# Patient Record
Sex: Female | Born: 1985 | Race: Black or African American | Hispanic: No | Marital: Single | Smoking: Current every day smoker
Health system: Southern US, Community
[De-identification: ages and names within clinical notes are randomized; demographics above are authoritative.]

## PROBLEM LIST (undated history)

## (undated) DIAGNOSIS — D573 Sickle-cell trait: Secondary | ICD-10-CM

---

## 2004-09-01 ENCOUNTER — Emergency Department (HOSPITAL_COMMUNITY): Admission: EM | Admit: 2004-09-01 | Discharge: 2004-09-01 | Payer: Self-pay | Admitting: Emergency Medicine

## 2004-10-11 ENCOUNTER — Emergency Department (HOSPITAL_COMMUNITY): Admission: EM | Admit: 2004-10-11 | Discharge: 2004-10-11 | Payer: Self-pay | Admitting: Emergency Medicine

## 2005-01-03 ENCOUNTER — Inpatient Hospital Stay (HOSPITAL_COMMUNITY): Admission: AD | Admit: 2005-01-03 | Discharge: 2005-01-03 | Payer: Self-pay | Admitting: Obstetrics and Gynecology

## 2005-05-12 ENCOUNTER — Emergency Department (HOSPITAL_COMMUNITY): Admission: EM | Admit: 2005-05-12 | Discharge: 2005-05-12 | Payer: Self-pay | Admitting: Family Medicine

## 2005-09-03 ENCOUNTER — Emergency Department (HOSPITAL_COMMUNITY): Admission: EM | Admit: 2005-09-03 | Discharge: 2005-09-04 | Payer: Self-pay | Admitting: Emergency Medicine

## 2005-11-28 ENCOUNTER — Ambulatory Visit (HOSPITAL_COMMUNITY): Admission: RE | Admit: 2005-11-28 | Discharge: 2005-11-28 | Payer: Self-pay | Admitting: Obstetrics & Gynecology

## 2006-01-29 ENCOUNTER — Inpatient Hospital Stay (HOSPITAL_COMMUNITY): Admission: AD | Admit: 2006-01-29 | Discharge: 2006-01-31 | Payer: Self-pay | Admitting: Obstetrics & Gynecology

## 2006-03-10 ENCOUNTER — Inpatient Hospital Stay (HOSPITAL_COMMUNITY): Admission: AD | Admit: 2006-03-10 | Discharge: 2006-03-11 | Payer: Self-pay | Admitting: Obstetrics & Gynecology

## 2006-03-20 ENCOUNTER — Inpatient Hospital Stay (HOSPITAL_COMMUNITY): Admission: AD | Admit: 2006-03-20 | Discharge: 2006-03-22 | Payer: Self-pay | Admitting: Obstetrics

## 2006-05-09 ENCOUNTER — Emergency Department (HOSPITAL_COMMUNITY): Admission: EM | Admit: 2006-05-09 | Discharge: 2006-05-10 | Payer: Self-pay | Admitting: Emergency Medicine

## 2006-07-01 ENCOUNTER — Emergency Department (HOSPITAL_COMMUNITY): Admission: EM | Admit: 2006-07-01 | Discharge: 2006-07-01 | Payer: Self-pay | Admitting: Emergency Medicine

## 2006-10-17 ENCOUNTER — Emergency Department (HOSPITAL_COMMUNITY): Admission: EM | Admit: 2006-10-17 | Discharge: 2006-10-17 | Payer: Self-pay | Admitting: Emergency Medicine

## 2007-06-16 ENCOUNTER — Inpatient Hospital Stay (HOSPITAL_COMMUNITY): Admission: AD | Admit: 2007-06-16 | Discharge: 2007-06-16 | Payer: Self-pay | Admitting: Obstetrics & Gynecology

## 2007-08-18 ENCOUNTER — Ambulatory Visit (HOSPITAL_COMMUNITY): Admission: AD | Admit: 2007-08-18 | Discharge: 2007-08-18 | Payer: Self-pay | Admitting: Obstetrics

## 2007-08-18 ENCOUNTER — Encounter: Payer: Self-pay | Admitting: Obstetrics

## 2008-03-03 ENCOUNTER — Inpatient Hospital Stay (HOSPITAL_COMMUNITY): Admission: AD | Admit: 2008-03-03 | Discharge: 2008-03-03 | Payer: Self-pay | Admitting: Obstetrics

## 2008-05-20 ENCOUNTER — Ambulatory Visit (HOSPITAL_COMMUNITY): Admission: RE | Admit: 2008-05-20 | Discharge: 2008-05-20 | Payer: Self-pay | Admitting: Obstetrics

## 2008-06-03 ENCOUNTER — Ambulatory Visit (HOSPITAL_COMMUNITY): Admission: RE | Admit: 2008-06-03 | Discharge: 2008-06-03 | Payer: Self-pay | Admitting: Obstetrics

## 2008-06-08 ENCOUNTER — Inpatient Hospital Stay (HOSPITAL_COMMUNITY): Admission: AD | Admit: 2008-06-08 | Discharge: 2008-06-10 | Payer: Self-pay | Admitting: Obstetrics & Gynecology

## 2008-06-22 ENCOUNTER — Ambulatory Visit (HOSPITAL_COMMUNITY): Admission: RE | Admit: 2008-06-22 | Discharge: 2008-06-22 | Payer: Self-pay | Admitting: Obstetrics

## 2008-09-26 IMAGING — CR DG NECK SOFT TISSUE
1 series · 1 of 1 positions shown · non-contrast
Comparison: none

CLINICAL DATA: Sore throat.  Unable to swallow.  Hoarse. 
 LATERAL SOFT TISSUE NECK ? 1 VIEW:

[w soft tissue neck *]
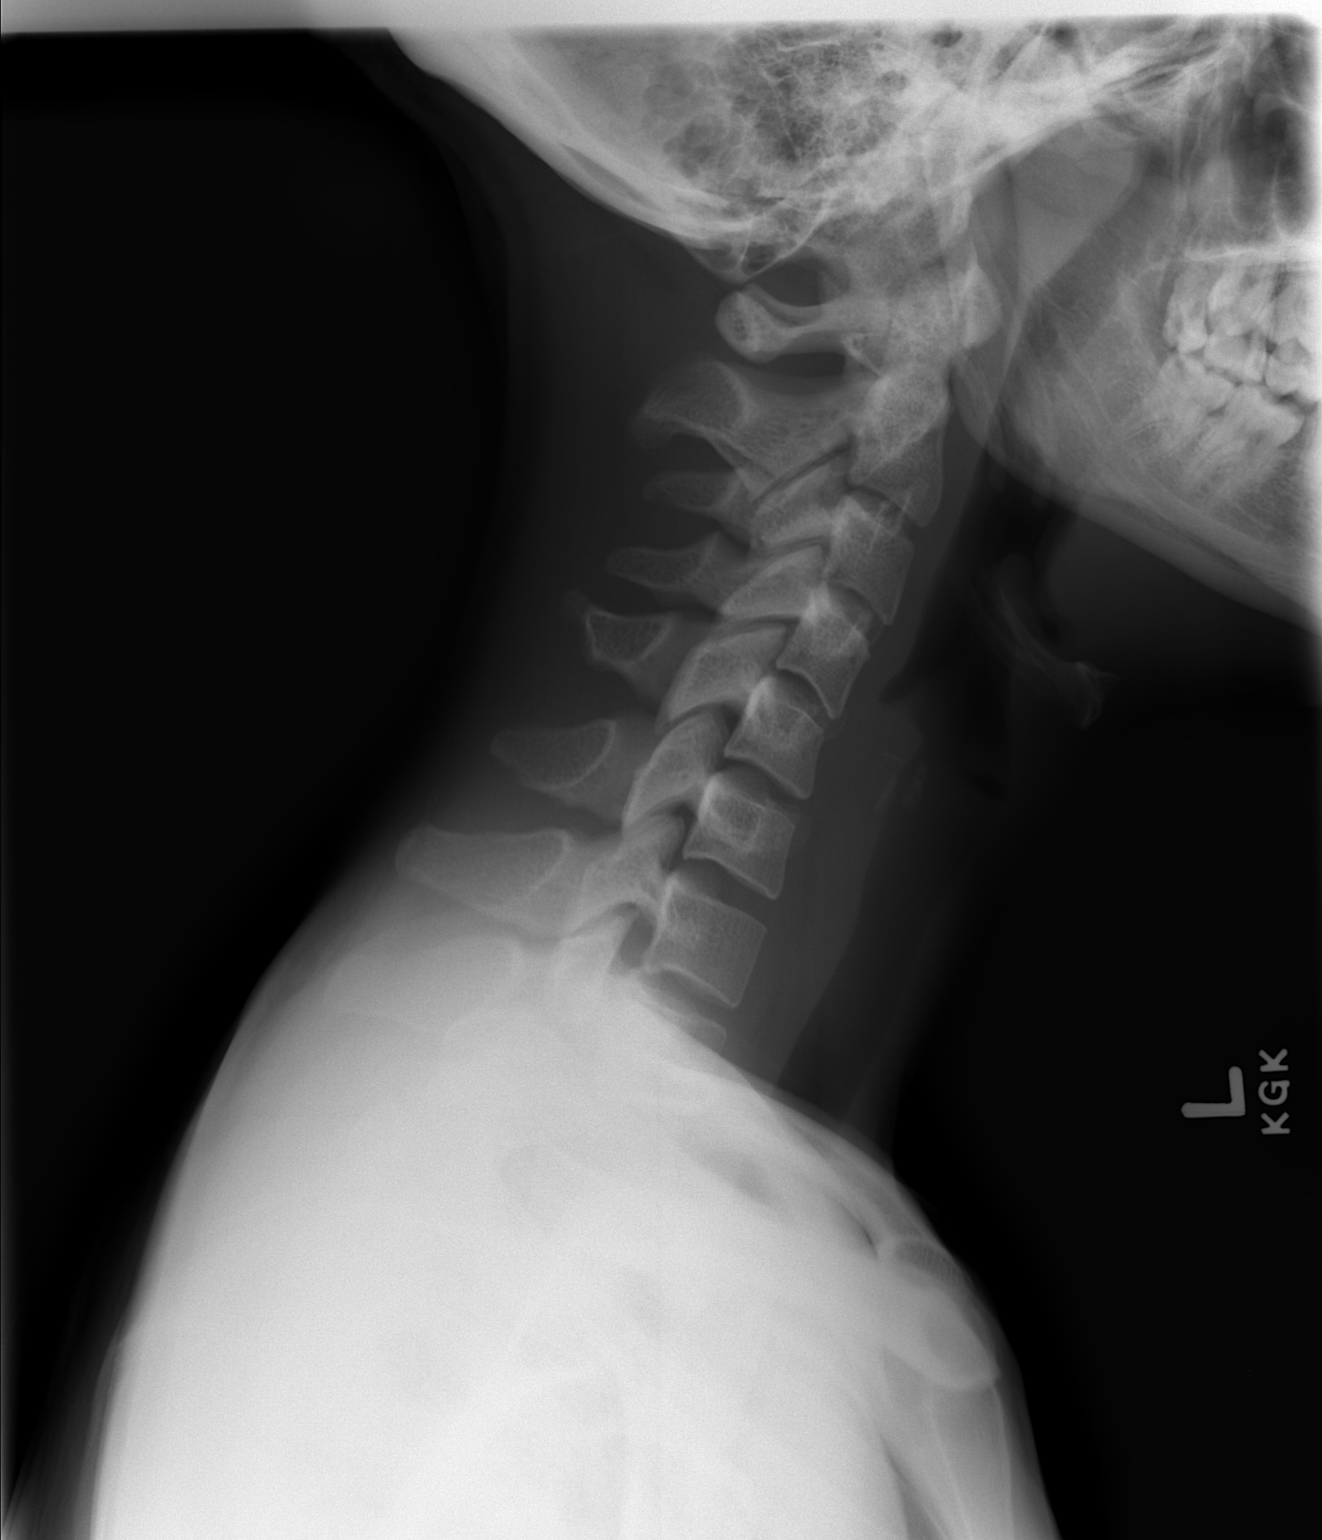

[1 of 1 positions shown; findings below may reference images not displayed]

FINDINGS: There is no abnormal prevertebral soft tissue swelling.  The bony structures appear normal.  The epiglottis is not narrowed.  Slight prominence of the lymphoid tissue at the base of the tongue without compromise of the airway.
IMPRESSION: No significant abnormality.

## 2008-10-12 ENCOUNTER — Inpatient Hospital Stay (HOSPITAL_COMMUNITY): Admission: AD | Admit: 2008-10-12 | Discharge: 2008-10-14 | Payer: Self-pay | Admitting: Obstetrics

## 2009-03-24 ENCOUNTER — Emergency Department (HOSPITAL_COMMUNITY): Admission: EM | Admit: 2009-03-24 | Discharge: 2009-03-24 | Payer: Self-pay | Admitting: Emergency Medicine

## 2009-05-16 ENCOUNTER — Encounter: Payer: Self-pay | Admitting: Emergency Medicine

## 2009-05-16 ENCOUNTER — Inpatient Hospital Stay (HOSPITAL_COMMUNITY): Admission: AD | Admit: 2009-05-16 | Discharge: 2009-05-21 | Payer: Self-pay | Admitting: Obstetrics

## 2009-10-08 ENCOUNTER — Inpatient Hospital Stay (HOSPITAL_COMMUNITY): Admission: AD | Admit: 2009-10-08 | Discharge: 2009-10-09 | Payer: Self-pay | Admitting: Obstetrics & Gynecology

## 2009-10-08 ENCOUNTER — Ambulatory Visit: Payer: Self-pay | Admitting: Advanced Practice Midwife

## 2009-10-12 ENCOUNTER — Emergency Department (HOSPITAL_COMMUNITY): Admission: EM | Admit: 2009-10-12 | Discharge: 2009-10-12 | Payer: Self-pay | Admitting: Emergency Medicine

## 2010-10-14 ENCOUNTER — Encounter: Payer: Self-pay | Admitting: Obstetrics

## 2010-12-09 LAB — URINALYSIS, ROUTINE W REFLEX MICROSCOPIC
Bilirubin Urine: NEGATIVE
Ketones, ur: NEGATIVE mg/dL
Nitrite: NEGATIVE
Urobilinogen, UA: 0.2 mg/dL (ref 0.0–1.0)
pH: 6 (ref 5.0–8.0)

## 2010-12-09 LAB — URINE CULTURE: Colony Count: >=100000

## 2010-12-29 LAB — DIFFERENTIAL
Basophils Absolute: 0 10*3/uL (ref 0.0–0.1)
Basophils Absolute: 0 10*3/uL (ref 0.0–0.1)
Basophils Relative: 0 % (ref 0–1)
Eosinophils Relative: 0 % (ref 0–5)
Eosinophils Relative: 1 % (ref 0–5)
Lymphocytes Relative: 3 % — ABNORMAL LOW (ref 12–46)
Lymphs Abs: 0.5 10*3/uL — ABNORMAL LOW (ref 0.7–4.0)
Monocytes Absolute: 0.6 10*3/uL (ref 0.1–1.0)
Neutro Abs: 16 10*3/uL — ABNORMAL HIGH (ref 1.7–7.7)

## 2010-12-29 LAB — CBC
HCT: 23.1 % — ABNORMAL LOW (ref 36.0–46.0)
HCT: 30 % — ABNORMAL LOW (ref 36.0–46.0)
Hemoglobin: 7.4 g/dL — CL (ref 12.0–15.0)
MCHC: 30.4 g/dL (ref 30.0–36.0)
MCHC: 32 g/dL (ref 30.0–36.0)
MCV: 67.5 fL — ABNORMAL LOW (ref 78.0–100.0)
Platelets: 254 10*3/uL (ref 150–400)
RDW: 20.4 % — ABNORMAL HIGH (ref 11.5–15.5)

## 2010-12-29 LAB — URINALYSIS, ROUTINE W REFLEX MICROSCOPIC
Bilirubin Urine: NEGATIVE
Hgb urine dipstick: NEGATIVE
Protein, ur: NEGATIVE mg/dL
Urobilinogen, UA: 0.2 mg/dL (ref 0.0–1.0)

## 2010-12-29 LAB — IRON AND TIBC
Iron: 28 ug/dL — ABNORMAL LOW (ref 42–135)
Saturation Ratios: 9 % — ABNORMAL LOW (ref 20–55)
UIBC: 278 ug/dL

## 2010-12-29 LAB — GENTAMICIN LEVEL, PEAK: Gentamicin Pk: 6.4 ug/mL (ref 5.0–10.0)

## 2010-12-29 LAB — COMPREHENSIVE METABOLIC PANEL
AST: 21 U/L (ref 0–37)
Albumin: 3.8 g/dL (ref 3.5–5.2)
BUN: 9 mg/dL (ref 6–23)
CO2: 21 mEq/L (ref 19–32)
Calcium: 8.8 mg/dL (ref 8.4–10.5)
Creatinine, Ser: 0.84 mg/dL (ref 0.4–1.2)
GFR calc Af Amer: >60 mL/min (ref >60)
GFR calc non Af Amer: >60 mL/min (ref >60)

## 2010-12-29 LAB — RPR: RPR Ser Ql: NONREACTIVE

## 2010-12-29 LAB — WET PREP, GENITAL
Trich, Wet Prep: NONE SEEN
Yeast Wet Prep HPF POC: NONE SEEN

## 2010-12-29 LAB — LIPASE, BLOOD: Lipase: <10 U/L — ABNORMAL LOW (ref 11–59)

## 2010-12-29 LAB — GC/CHLAMYDIA PROBE AMP, GENITAL: GC Probe Amp, Genital: POSITIVE — AB

## 2010-12-30 LAB — URINALYSIS, ROUTINE W REFLEX MICROSCOPIC
Nitrite: NEGATIVE
Specific Gravity, Urine: 1.013 (ref 1.005–1.030)
Urobilinogen, UA: 0.2 mg/dL (ref 0.0–1.0)
pH: 6.5 (ref 5.0–8.0)

## 2010-12-30 LAB — CBC
HCT: 30 % — ABNORMAL LOW (ref 36.0–46.0)
Hemoglobin: 9.5 g/dL — ABNORMAL LOW (ref 12.0–15.0)
RBC: 4.54 MIL/uL (ref 3.87–5.11)
RDW: 21.2 % — ABNORMAL HIGH (ref 11.5–15.5)

## 2010-12-30 LAB — URINE MICROSCOPIC-ADD ON

## 2010-12-30 LAB — WET PREP, GENITAL

## 2010-12-30 LAB — POCT I-STAT, CHEM 8
Calcium, Ion: 1.12 mmol/L (ref 1.12–1.32)
Creatinine, Ser: 1.1 mg/dL (ref 0.4–1.2)
Glucose, Bld: 83 mg/dL (ref 70–99)
HCT: 34 % — ABNORMAL LOW (ref 36.0–46.0)
Hemoglobin: 11.6 g/dL — ABNORMAL LOW (ref 12.0–15.0)
Potassium: 3.9 mEq/L (ref 3.5–5.1)
TCO2: 21 mmol/L (ref 0–100)

## 2010-12-30 LAB — POCT PREGNANCY, URINE: Preg Test, Ur: NEGATIVE

## 2010-12-30 LAB — GC/CHLAMYDIA PROBE AMP, GENITAL: Chlamydia, DNA Probe: NEGATIVE

## 2010-12-30 LAB — DIFFERENTIAL
Basophils Relative: 0 % (ref 0–1)
Eosinophils Absolute: 0.1 10*3/uL (ref 0.0–0.7)
Eosinophils Relative: 1 % (ref 0–5)
Lymphocytes Relative: 11 % — ABNORMAL LOW (ref 12–46)
Neutro Abs: 9.3 10*3/uL — ABNORMAL HIGH (ref 1.7–7.7)
Neutrophils Relative %: 86 % — ABNORMAL HIGH (ref 43–77)

## 2010-12-30 LAB — RPR: RPR Ser Ql: NONREACTIVE

## 2011-01-07 LAB — CBC
HCT: 26.1 % — ABNORMAL LOW (ref 36.0–46.0)
HCT: 29.3 % — ABNORMAL LOW (ref 36.0–46.0)
Hemoglobin: 9.7 g/dL — ABNORMAL LOW (ref 12.0–15.0)
MCHC: 33.2 g/dL (ref 30.0–36.0)
MCHC: 33.4 g/dL (ref 30.0–36.0)
MCV: 83.6 fL (ref 78.0–100.0)
MCV: 84 fL (ref 78.0–100.0)
Platelets: 197 10*3/uL (ref 150–400)
Platelets: 237 K/uL (ref 150–400)
RBC: 3.11 MIL/uL — ABNORMAL LOW (ref 3.87–5.11)
RBC: 3.51 MIL/uL — ABNORMAL LOW (ref 3.87–5.11)
RDW: 14.4 % (ref 11.5–15.5)
WBC: 11.1 K/uL — ABNORMAL HIGH (ref 4.0–10.5)
WBC: 12.5 10*3/uL — ABNORMAL HIGH (ref 4.0–10.5)

## 2011-02-05 NOTE — Op Note (Signed)
NAME:  MANROOP, JAKUBOWICZ             ACCOUNT NO.:  [id number]   MEDICAL RECORD NO.:  [id number]          PATIENT TYPE:  AMB   LOCATION:  SDC                           FACILITY:  [facility]   PHYSICIAN:  Charles A. Clearance Coots, M.D.DATE OF BIRTH:  12-08-1985   DATE OF PROCEDURE:  08/18/2007  DATE OF DISCHARGE:                               OPERATIVE REPORT   PREOPERATIVE DIAGNOSIS:  A 10-week spontaneous abortion, incomplete.   POSTOPERATIVE DIAGNOSIS:  A 10-week spontaneous abortion, incomplete.   PROCEDURE:  Suction, dilation and evacuation.   SURGEON:  Coral Ceo, MD   ANESTHESIA:  MAC with paracervical block 1% Xylocaine.   FINDINGS:  Cervix open with products of products of conception in os.   SPECIMEN:  Products of conception.   DISPOSITION OF SPECIMEN:  Pathology.   ESTIMATED BLOOD LOSS:  Approximately 100 mL.   COMPLICATIONS:  None.   OPERATION:  The patient was brought to the operating room and after  satisfactory IV sedation, legs were brought up in stirrups and the  vagina was prepped and draped in usual sterile fashion.  The urinary  bladder was emptied of approximately 200 mL of clear urine.  Bimanual  examination revealed the uterus to be mid position, approximately 10  weeks' size.  Sterile speculum was inserted in the vaginal vault and the  cervix was isolated.  The cervix was noted to be open with products of  conception and clot in the cervical os.  The anterior lip of the cervix  was grasped with a small ring forceps and a #10 suction catheter was  easily introduced to the uterine cavity and all contents were evacuated.  The endometrial surface was then curetted with a small sharp curette and  the entire endometrial surface felt gritty and there was no active  bleeding noted.  All instruments were then retired.  Bimanual  examination at the conclusion of the case revealed the uterus to be firm  and mid position.  The patient tolerated the procedure well and was  transported to the recovery room in satisfactory condition.      Charles A. Clearance Coots, M.D.  Electronically Signed     CAH/MEDQ  D:  08/18/2007  T:  08/18/2007  Job:  [id number]

## 2011-02-05 NOTE — H&P (Signed)
NAME:  Kellie Payne, Kellie Payne             ACCOUNT NO.:  [id number]   MEDICAL RECORD NO.:  [id number]          PATIENT TYPE:  INP   LOCATION:  9309                          FACILITY:  [facility]   PHYSICIAN:  Roseanna Rainbow, M.D.DATE OF BIRTH:  [birth year]   DATE OF ADMISSION:  06/08/2008  DATE OF DISCHARGE:                              HISTORY & PHYSICAL   CHIEF COMPLAINTS:  The patient is a 25 year old para 2 with an estimated  date of confinement of October 27, 2008, with an intrauterine pregnancy  at 19 plus weeks complaining of cramping and bleeding.   HISTORY OF PRESENT ILLNESS:  The patient reports intercourse 2 days ago.  Yesterday evening, she reports onset of bleeding and cramping.  She was  resting when these symptoms began.  The bleeding and cramping have  become progressively worse during the day.  She denies rupture of  membranes.  She did have an episode of emesis yesterday, no diarrhea or  fever.  She also denies dysuria or abnormal vaginal discharge prior to  the bleeding.  She reports current tobacco use.  She denies any  recreational drug use or recent trauma.   PAST OB/GYN HISTORY:  She is status post 2 NSVDs at term without any  preterm labor.  There is a history of a voluntary termination of  pregnancy and a first trimester spontaneous abortion.   PAST MEDICAL HISTORY:  She has sickle cell trait.   PAST SURGICAL HISTORY:  There is a remote history of possible laparotomy  and bowel surgery.   FAMILY HISTORY:  Sickle cell disease.   SOCIAL HISTORY:  Please see the above.   ALLERGIES:  No known drug allergies.   REVIEW OF SYSTEMS:  GENERAL:  Please see the above.  GU:  Please see the  above.  GI:  Please see the above.   PHYSICAL EXAMINATION:  VITAL SIGNS:  Stable, afebrile.  Fetal heart  tones by Doppler 160s.  GENERAL:  Moderate distress.  ABDOMEN:  Mildly tender.  PELVIC:  On speculum exam, the cervix appears closed.  There is small  heme noted on digital  exam.  The cervix as a fingertip long.  There is a  small lower uterine segment development. GC and chlamydia DNA probes  were sent.   ASSESSMENT:  Intrauterine pregnancy at 19 plus weeks, threatened  abortion, viable pregnancy at present.   PLAN:  Admission, supportive care.  We will check ABO and Rh, urine drug  screen, urine culture and sensitivity, and the GC and chlamydia DNA  probes sent.      Roseanna Rainbow, M.D.  Electronically Signed     LAJ/MEDQ  D:  06/08/2008  T:  06/09/2008  Job:  [id number]

## 2011-02-05 NOTE — Discharge Summary (Signed)
NAME:  Kellie Payne, Kellie Payne             ACCOUNT NO.:  [id number]   MEDICAL RECORD NO.:  [id number]          PATIENT TYPE:  INP   LOCATION:  9307                          [facility]ACILITY:  [facility]   PHYSICIAN:  Roseanna Rainbow, M.D.DATE OF BIRTH:  1986/03/04   DATE OF ADMISSION:  05/16/2009  DATE OF DISCHARGE:  05/21/2009                               DISCHARGE SUMMARY   CHIEF COMPLAINT:  The patient is a 25 year old African American female  with a last menstrual period on May 05, 2009, who presented to the  [facility] Emergency Department complaining of lower abdominal pain.   HISTORY OF PRESENT ILLNESS:  Please see the above.  The patient also  reports a new-onset abnormal bleeding 1 day prior to presentation.  She  has had abnormal bleeding over the last month as well.   PAST SURGICAL HISTORY:  She denies.   PAST MEDICAL HISTORY:  Sickle cell trait.   MEDICATIONS:  None.   ALLERGIES:  No known drug allergies.   SOCIAL HISTORY:  She is single.  There is current tobacco use.  She  denies any alcohol or drug use.   FAMILY HISTORY:  Hypertension.   PHYSICAL EXAMINATION:  VITAL SIGNS:  Stable, afebrile.  LUNGS:  Clear to auscultation bilaterally.  HEART:  Regular rate and rhythm.  ABDOMEN:  Tender diffusely.  PELVIC:  Deferred.   OTHER WORKUP:  CT scan of abdomen and pelvis moderate free fluid in the  pelvis, ovaries enlarged, and probable hydrosalpinx.   ASSESSMENT AND PLAN:  Acute pelvic inflammatory disease, possible tubo-  ovarian complex or abscess.   PLAN:  Admission, broad-spectrum parenteral antibiotics, check complete  STD testing.   HOSPITAL COURSE:  The patient was admitted and started on gentamicin and  clindamycin parenterally.  She remained afebrile.  On hospital day 3,  white blood cell count was 10,500, hemoglobin was 7.4.  The patient has  a history of chronic anemia.  She was started on ferrous sulfate and a  multivitamin.  Her pain had required a morphine  PCA and this was  discontinued on hospital day 4 as her pain improved.  The HIV was  nonreactive.  The RPR was nonreactive.  Iron studies were consistent  with iron deficiency anemia. A GC probe was positive on May 15, 2009.  The chlamydia probe was negative.  Wet prep showed moderate clue cells,  and an urine pregnancy test was negative at the time of admission.  A  comprehensive metabolic profile was normal as well.  The patient  received Depo-Provera prior to discharge.   DISCHARGE DIAGNOSIS:  Acute pelvic inflammatory disease rule out a tubo-  ovarian abscess.   CONDITION:  Improved.   DIET:  Regular.   ACTIVITY:  Pelvic rest.   MEDICATIONS:  1. Over-the-counter ferrous sulfate and multivitamin recommended.  2. Doxycycline 100 mg p.o. b.i.d. for 7 days.  3. Flagyl 500 mg 1 tab p.o. b.i.d. x7 days.   DISPOSITION:  The patient was to follow up in the office in 2 weeks.      Roseanna Rainbow, M.D.  Electronically Signed  LAJ/MEDQ  D:  05/21/2009  T:  05/21/2009  Job:  [id number]

## 2011-02-08 NOTE — Discharge Summary (Signed)
NAME:  Kellie Payne, Kellie Payne             ACCOUNT NO.:  [id number]   MEDICAL RECORD NO.:  [id number]          PATIENT TYPE:  INP   LOCATION:  9168                          [facility]ACILITY:  [facility]   PHYSICIAN:  Roseanna Rainbow, M.D.DATE OF BIRTH:  Feb 12, 1986   DATE OF ADMISSION:  03/10/2006  DATE OF DISCHARGE:  03/11/2006                                 DISCHARGE SUMMARY   CHIEF COMPLAINT:  The patient is a 25 year old, gravida 3 para 1-0-1-1, with  an estimated date of confinement of July 10, complaining of contractions.   HISTORY OF PRESENT ILLNESS:  Please see the above.  The membranes were  intact.   PRENATAL LABS:  Negative GBS.   PHYSICAL EXAMINATION:  HEENT:  Normocephalic, atraumatic.  BREASTS:  No masses, nontender, no discharge.  LUNGS:  Clear to auscultation.  HEART:  Regular rate and rhythm.  No murmurs, rubs or gallops.  ABDOMEN:  Fundal height consistent with term pregnancy.  Estimated fetal  weight, by Leopold's, 6 pounds 12 ounces.  EXTREMITIES:  Nontender.  No clubbing, cyanosis or edema.   ASSESSMENT:  Primipara in early labor.   PLAN:  Admission and augmentation of labor with low dose Pitocin.   The patient had failed to dilate further.  The Pitocin was discontinued.  Please note, that an epidural catheter had been placed for pain management  and this was removed.  The patient was subsequently discharged to home.   DISCHARGE DIAGNOSIS:  1. Intrauterine pregnancy at 37 weeks.  2. False labor.   CONDITION:  Good.   DIET:  Regular.   ACTIVITY:  Ad lib.   MEDICATIONS:  Resume home medications.   DISPOSITION:  The patient was to follow up in the office on June 20.      Roseanna Rainbow, M.D.  Electronically Signed     LAJ/MEDQ  D:  04/25/2006  T:  04/25/2006  Job:  [id number]

## 2011-02-08 NOTE — Discharge Summary (Signed)
NAME:  Kellie Payne, Kellie Payne             ACCOUNT NO.:  [id number]   MEDICAL RECORD NO.:  [id number]          PATIENT TYPE:  INP   LOCATION:  9309                          [facility]ILITY[facility]   PHYSICIAN:  Roseanna Rainbow, M.D.DATE OF BIRTH:  03/04/1986   DATE OF ADMISSION:  06/08/2008  DATE OF DISCHARGE:  06/10/2008                               DISCHARGE SUMMARY   CHIEF COMPLAINT:  The patient is a 25 year old para 2 with an estimated  date of confinement of October 27, 2008, with an intrauterine pregnancy  at 19 plus weeks complaining of cramping and bleeding.  Please see the  dictated history and physical.   HOSPITAL COURSE:  The patient was admitted.  Her symptoms resolved.  She  was discharged to home on September 18 in stable condition.  Hemoglobin  was 10.9 on September 16.  Antiphospholipid syndrome screening labs were  negative.  A urine drug screen was positive for cannabinoids.  GC and  Chlamydia probes were negative.  Anti-[place] antibodies were negative as  well as anti-double stranded DNA.   DISCHARGE DIAGNOSIS:  Intrauterine pregnancy at 19 plus weeks threatened  abortion.   CONDITION:  Stable.   DIET:  Regular.   ACTIVITY:  Pelvic rest, modified bed rest.   MEDICATIONS:  Continue preadmission medications.   DISPOSITION:  Keep the previously made appointment in the office.      Roseanna Rainbow, M.D.  Electronically Signed     LAJ/MEDQ  D:  07/11/2008  T:  07/11/2008  Job:  [id number]

## 2011-06-20 LAB — URINALYSIS, ROUTINE W REFLEX MICROSCOPIC
Bilirubin Urine: NEGATIVE
Glucose, UA: NEGATIVE
Hgb urine dipstick: NEGATIVE
Specific Gravity, Urine: 1.015

## 2011-06-20 LAB — URINE CULTURE

## 2011-06-20 LAB — URINE MICROSCOPIC-ADD ON

## 2011-06-20 LAB — WET PREP, GENITAL: Trich, Wet Prep: NONE SEEN

## 2011-06-24 LAB — RAPID URINE DRUG SCREEN, HOSP PERFORMED
Amphetamines: NOT DETECTED
Barbiturates: NOT DETECTED
Benzodiazepines: NOT DETECTED
Cocaine: NOT DETECTED
Opiates: NOT DETECTED

## 2011-06-24 LAB — CBC
RBC: 3.46 — ABNORMAL LOW
WBC: 12.4 — ABNORMAL HIGH

## 2011-06-24 LAB — ABO/RH: ABO/RH(D): O POS

## 2011-06-24 LAB — LUPUS ANTICOAGULANT PANEL
Lupus Anticoagulant: NOT DETECTED
PTT Lupus Anticoagulant: 39.9 (ref 36.3–48.8)

## 2011-06-24 LAB — URINALYSIS, ROUTINE W REFLEX MICROSCOPIC
Bilirubin Urine: NEGATIVE
Nitrite: NEGATIVE
Protein, ur: NEGATIVE
Specific Gravity, Urine: 1.01
Urobilinogen, UA: 0.2

## 2011-06-24 LAB — GC/CHLAMYDIA PROBE AMP, GENITAL
Chlamydia, DNA Probe: NEGATIVE
GC Probe Amp, Genital: NEGATIVE

## 2011-06-24 LAB — URINE CULTURE
Colony Count: NO GROWTH
Special Requests: NEGATIVE

## 2011-06-24 LAB — PLT GLYCOPROTEIN (INDIRECT) AUTOABS
Plt GP Ia/IIa Autoabs: NOT DETECTED
Plt Glycoprotein Ib/IX Autoabs: NOT DETECTED

## 2011-06-24 LAB — EXTRACTABLE NUCLEAR ANTIGEN ANTIBODY
SSA (Ro) (ENA) Antibody, IgG: <0.2 AI (ref <1.0)
SSB (La) (ENA) Antibody, IgG: <0.2 AI (ref <1.0)

## 2011-06-24 LAB — JO-1 ANTIBODY-IGG: Jo-1 Antibody, IgG: <0.2 AI (ref <1.0)

## 2011-07-02 LAB — CBC
HCT: 33 — ABNORMAL LOW
Hemoglobin: 11.5 — ABNORMAL LOW
RBC: 3.6 — ABNORMAL LOW
WBC: 10.3

## 2011-07-04 LAB — URINALYSIS, ROUTINE W REFLEX MICROSCOPIC
Bilirubin Urine: NEGATIVE
Hgb urine dipstick: NEGATIVE
Nitrite: NEGATIVE
Specific Gravity, Urine: 1.02
pH: 6.5

## 2011-07-04 LAB — WET PREP, GENITAL
Trich, Wet Prep: NONE SEEN
Yeast Wet Prep HPF POC: NONE SEEN

## 2011-07-04 LAB — POCT PREGNANCY, URINE
Operator id: 145751
Preg Test, Ur: NEGATIVE

## 2011-07-04 LAB — GC/CHLAMYDIA PROBE AMP, GENITAL: GC Probe Amp, Genital: POSITIVE — AB

## 2011-08-12 IMAGING — CT CT ABDOMEN W/ CM
1 of 2 series · 14 of 32 positions shown, 18 images · IV contrast (omnipaque)
Comparison: Abdominal radiograph performed 05/15/2009

CT ABDOMEN

CLINICAL DATA: Abdominal pain and fever; nausea and vomiting.
Leukocytosis.  History of UTIs and Chlamydia.

CT ABDOMEN AND PELVIS WITH CONTRAST
TECHNIQUE: Multidetector CT imaging of the abdomen and pelvis was
performed using the standard protocol following bolus
administration of intravenous contrast.
Contrast: 100 mL Omnipaque 300 IV contrast

[Series 2: rtn ap with st · axial · 0.58mm/px · z∈[-490,-130]mm · 14 of 83 slices shown, 18 images]
[im 7/83  soft-tissue]
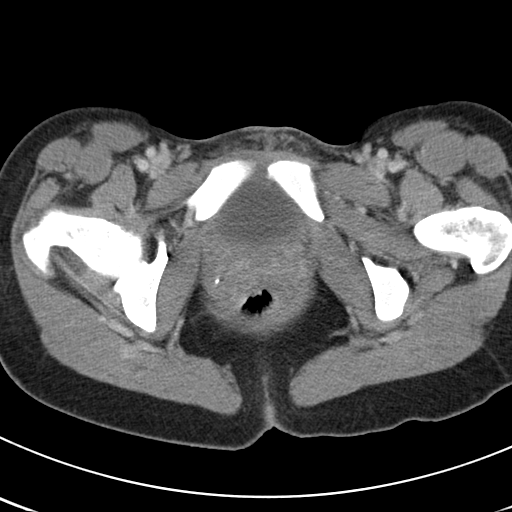
[im 7/83  bone]
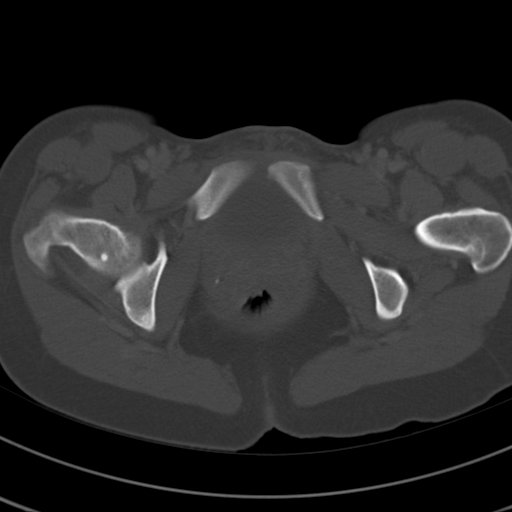
[im 13/83  soft-tissue]
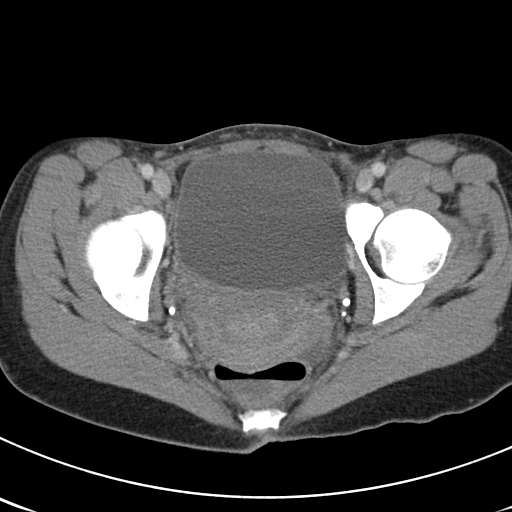
[im 19/83  soft-tissue]
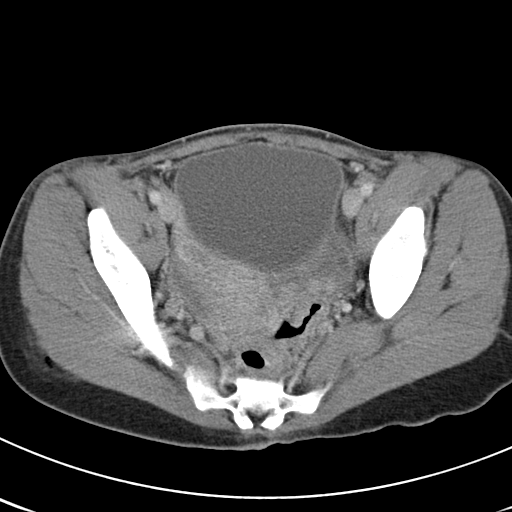
[im 26/83  soft-tissue]
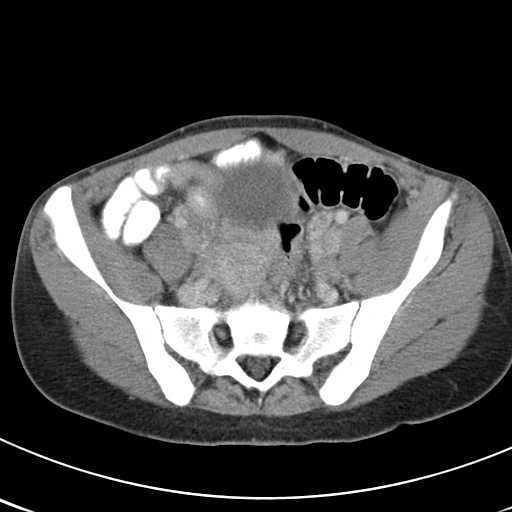
[im 32/83  soft-tissue]
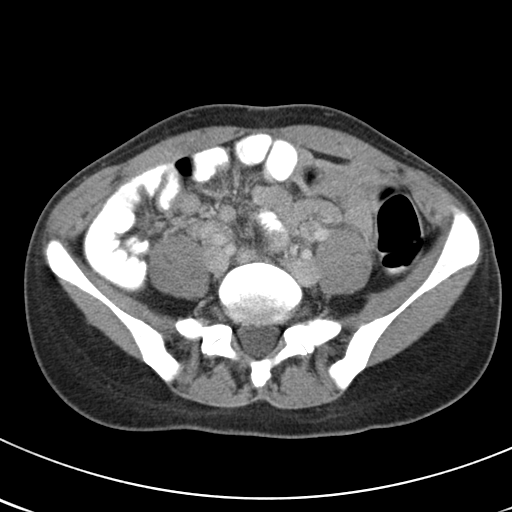
[im 38/83  soft-tissue]
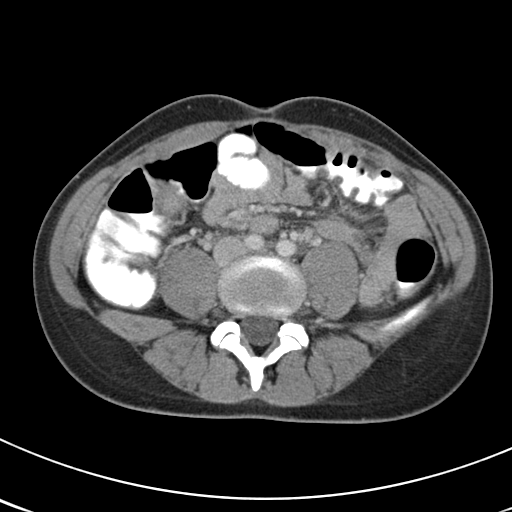
[im 45/83  soft-tissue]
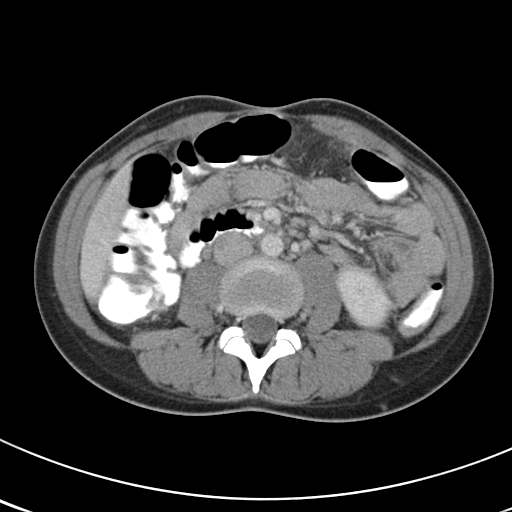
[im 51/83  soft-tissue]
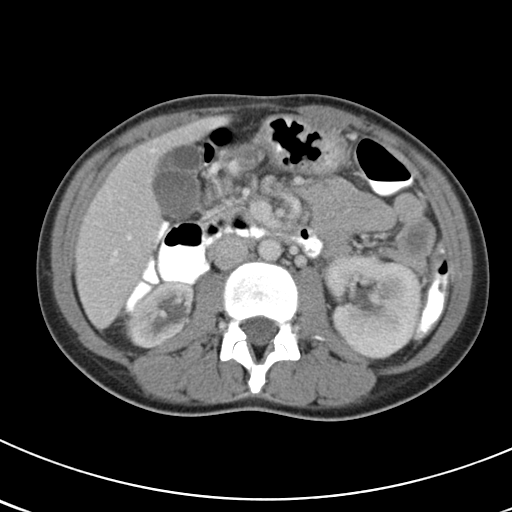
[im 57/83  soft-tissue]
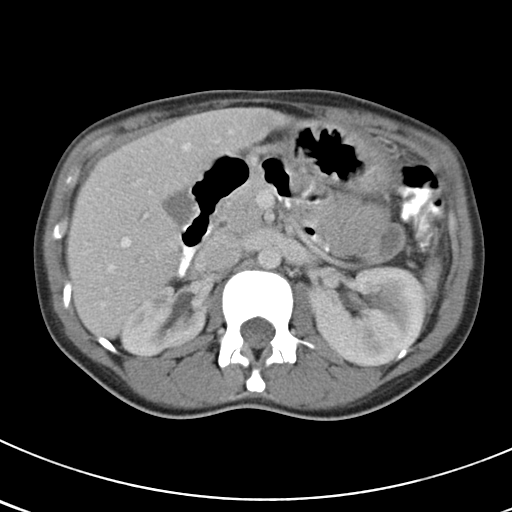
[im 57/83  bone]
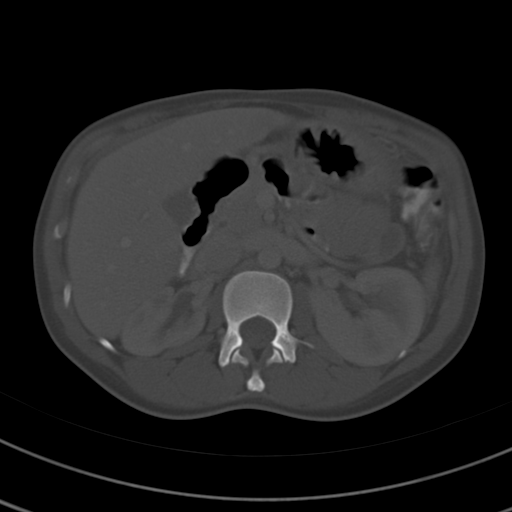
[im 64/83  soft-tissue]
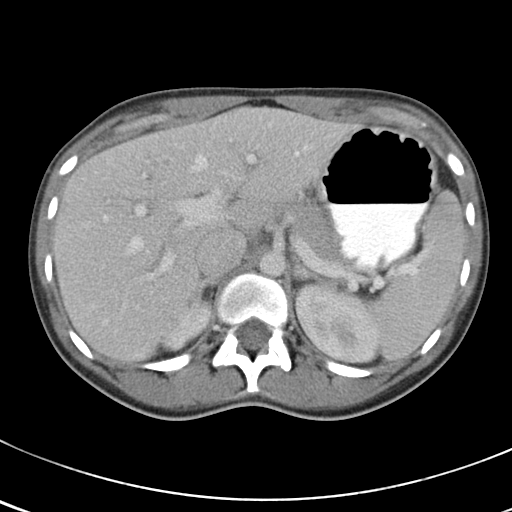
[im 70/83  soft-tissue]
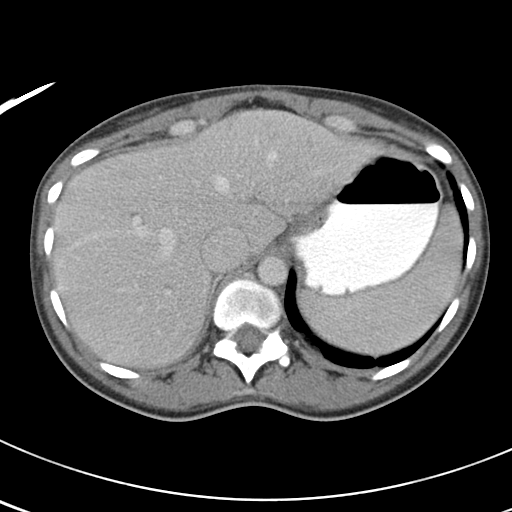
[im 70/83  lung]
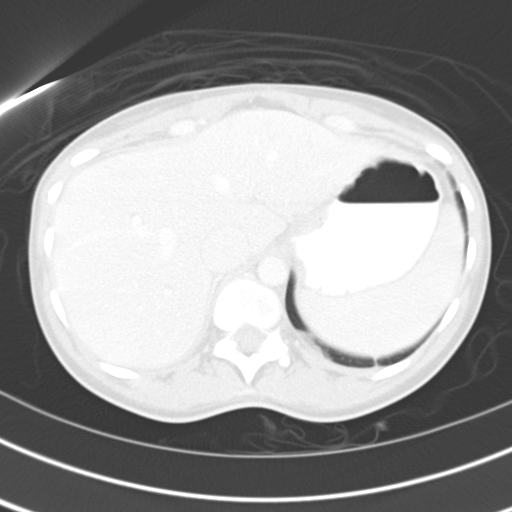
[im 73/83  lung]
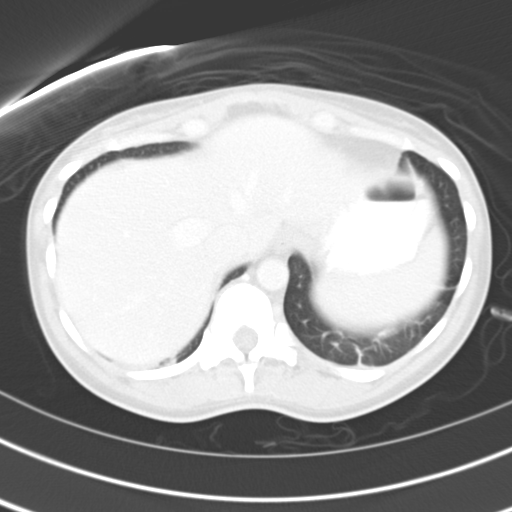
[im 76/83  soft-tissue]
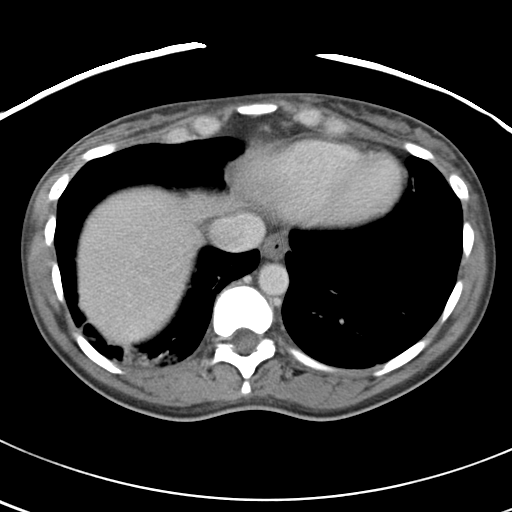
[im 76/83  lung]
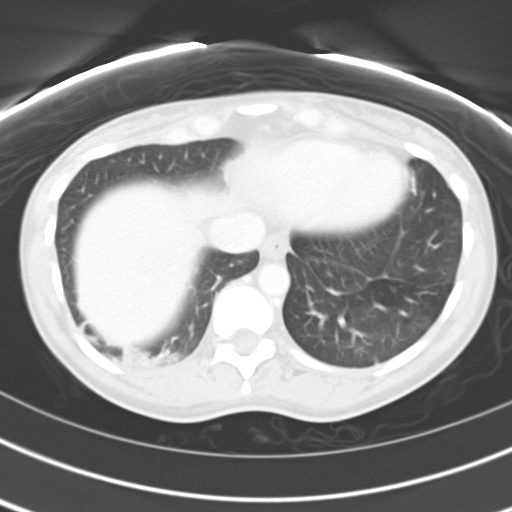
[im 79/83  lung]
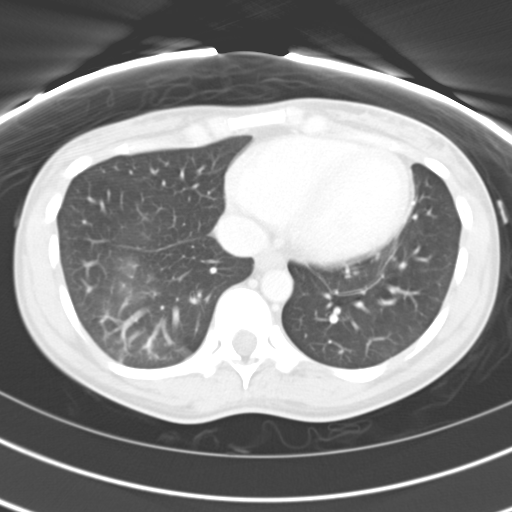

[14 of 32 positions shown; findings below may reference images not displayed]

FINDINGS: Mild bibasilar atelectasis is noted, right greater than
left; right-sided atelectasis is slightly nodular in character.

The liver and spleen are unremarkable in appearance.  There is mild
prominence of the intrahepatic biliary ducts, without evidence of
common hepatic duct dilatation.  The gallbladder is within normal
limits; a phrygian cap is noted.  The pancreas and adrenal glands
are unremarkable.

The left kidney is significantly larger than the right; a duplex
left kidney is noted.  There is mild prominence of the renal
collecting system bilaterally, without evidence of significant
hydronephrosis.  No obstructing stones are identified.  Renal
asymmetry may reflect a developmental variant.

No free fluid is identified.  The small bowel is unremarkable in
appearance.  The stomach is within normal limits.  No acute
vascular abnormalities are seen.
IMPRESSION: 1.  Asymmetry of the kidneys, with a duplex left-sided kidney; no
evidence of significant hydronephrosis.
2.  Mild bibasilar atelectasis, right greater than left.

CT PELVIS
FINDINGS: There is an edematous appearance to the uterus, with
diffusely decreased wall attenuation and high-density fluid in the
uterine cavity.  In addition, there is a mildly tubular appearing
structure within the right adnexa, raising concern for
hydrosalpinx.  Given the patient's symptoms, findings could reflect
PID.  A small amount of free fluid is noted within the pelvic cul-
de-sac.  The ovaries are difficult to fully distinguish, but appear
enlarged bilaterally.  Clinical correlation is suggested; pelvic
ultrasound may be considered for further evaluation, as deemed
clinically appropriate.

The colon is filled with contrast and is unremarkable in
appearance; the appendix is not seen.  Note is made of a single
rounded focus of air in the expected location of the mesentery of
the ascending colon (image 49 of 83).  This is of uncertain
significance; no portal venous gas is identified.

The bladder is significantly distended and within normal limits.
Scattered small inguinal lymph nodes are noted bilaterally, without
evidence of inguinal lymphadenopathy.

No acute osseous abnormalities are seen. There is incomplete fusion
of the spinous process of L1.
IMPRESSION: 1.  Edematous appearance to the uterus, with a tubular appearing
structure in the right adnexa and a small amount of free fluid in
the pelvic cul-de-sac.  The left ovary appears enlarged.  Findings
raise concern for PID; the tubular structure could reflect a
hydrosalpinx.  Clinical correlation is suggested; pelvic ultrasound
may be considered for further evaluation as deemed clinically
appropriate.
2.  Single small rounded focus of air in the expected location of
the mesentery of the ascending colon (image 49 of 83).  This is of
uncertain significance; no portal venous gas is seen.  The colon is
otherwise unremarkable in appearance.

## 2014-11-23 ENCOUNTER — Encounter (HOSPITAL_COMMUNITY): Payer: Self-pay

## 2014-11-23 ENCOUNTER — Emergency Department (HOSPITAL_COMMUNITY)
Admission: EM | Admit: 2014-11-23 | Discharge: 2014-11-24 | Disposition: A | Discharge status: Home / Self Care | Payer: Medicaid - Out of State | Attending: Emergency Medicine | Admitting: Emergency Medicine

## 2014-11-23 DIAGNOSIS — Z720 Tobacco use: Secondary | ICD-10-CM | POA: Insufficient documentation

## 2014-11-23 DIAGNOSIS — J020 Streptococcal pharyngitis: Secondary | ICD-10-CM | POA: Insufficient documentation

## 2014-11-23 DIAGNOSIS — Z862 Personal history of diseases of the blood and blood-forming organs and certain disorders involving the immune mechanism: Secondary | ICD-10-CM | POA: Diagnosis not present

## 2014-11-23 DIAGNOSIS — J029 Acute pharyngitis, unspecified: Secondary | ICD-10-CM | POA: Diagnosis present

## 2014-11-23 HISTORY — DX: Sickle-cell trait: D57.3

## 2014-11-23 LAB — RAPID STREP SCREEN (MED CTR MEBANE ONLY): Streptococcus, Group A Screen (Direct): POSITIVE — AB

## 2014-11-23 MED ORDER — HYDROCODONE-ACETAMINOPHEN 7.5-325 MG/15ML PO SOLN: 10.0000 mL | Freq: Four times a day (QID) | ORAL | Status: AC | PRN

## 2014-11-23 MED ORDER — PENICILLIN V POTASSIUM 250 MG PO TABS
500.0000 mg | ORAL_TABLET | Freq: Once | ORAL | Status: AC
Start: 2014-11-24 — End: 2014-11-24
  Administered 2014-11-24: 500 mg via ORAL
  Filled 2014-11-23: qty 2

## 2014-11-23 MED ORDER — IBUPROFEN 100 MG/5ML PO SUSP
600.0000 mg | Freq: Once | ORAL | Status: AC
  Administered 2014-11-24: 600 mg via ORAL
  Filled 2014-11-23: qty 30

## 2014-11-23 MED ORDER — PENICILLIN V POTASSIUM 500 MG PO TABS: 500.0000 mg | ORAL_TABLET | Freq: Three times a day (TID) | ORAL | Status: AC

## 2014-11-23 MED ORDER — IBUPROFEN 100 MG/5ML PO SUSP: 600.0000 mg | Freq: Four times a day (QID) | ORAL | Status: AC | PRN

## 2014-11-23 NOTE — ED Notes (Signed)
Pt. Reports sore throat since Monday. Reports pain with swallowing. Throat red.

## 2014-11-23 NOTE — ED Provider Notes (Signed)
CSN: [id number]     Arrival date & time 11/23/14  2254 History  This chart was scribed for Junius Finner, PA-C, working with Loren Racer, MD by Chestine Spore, ED Scribe. The patient was seen in room TR08C/TR08C at 11:19 PM.    Chief Complaint  Patient presents with  . Sore Throat      The history is provided by the patient. No language interpreter was used.    HPI Comments: Kellie Payne is a 29 y.o. female who presents to the Emergency Department complaining of sore throat onset 2 days ago. She states that she is having associated symptoms of trouble swallowing liquids as it makes the pain worse. Pain is 10/10 at worst.  She denies fevers, nausea, vomiting, abdominal pain, and any other symptoms. Denies sick contacts. Pt is currently visiting from [place]. Denies allergies to medications.    Past Medical History  Diagnosis Date  . Sickle cell trait    History reviewed. No pertinent past surgical history. No family history on file. History  Substance Use Topics  . Smoking status: Current Every Day Smoker    Types: Cigarettes  . Smokeless tobacco: Not on file  . Alcohol Use: Yes     Comment: occasionally   OB History    No data available     Review of Systems  Constitutional: Negative for fever.  HENT: Positive for sore throat.   Gastrointestinal: Negative for nausea, vomiting and abdominal pain.      Allergies  Review of patient's allergies indicates no known allergies.  Home Medications   Prior to Admission medications   Medication Sig Start Date End Date Taking? Authorizing Provider  HYDROcodone-acetaminophen (HYCET) 7.5-325 mg/15 ml solution Take 10 mLs by mouth every 6 (six) hours as needed for moderate pain or severe pain. 11/23/14 11/23/15  Junius Finner, PA-C  ibuprofen (CHILD IBUPROFEN) 100 MG/5ML suspension Take 30 mLs (600 mg total) by mouth every 6 (six) hours as needed for fever, mild pain or moderate pain. 11/23/14   Junius Finner, PA-C  penicillin  v potassium (VEETID) 500 MG tablet Take 1 tablet (500 mg total) by mouth 3 (three) times daily. For 10 days 11/23/14 11/30/14  Junius Finner, PA-C   BP 125/84 mmHg  Pulse 62  Temp(Src) 98.6 F (37 C) (Oral)  Resp 16  SpO2 97%  LMP 10/25/2014  Physical Exam  Constitutional: She is oriented to person, place, and time. She appears well-developed and well-nourished.  HENT:  Head: Normocephalic and atraumatic.  Right Ear: Hearing, tympanic membrane, external ear and ear canal normal.  Left Ear: Hearing, tympanic membrane, external ear and ear canal normal.  Mouth/Throat: Uvula is midline and mucous membranes are normal. There is trismus (mild) in the jaw. No uvula swelling. Posterior oropharyngeal edema (bilateral) and posterior oropharyngeal erythema present. No oropharyngeal exudate.  Eyes: EOM are normal.  Neck: Normal range of motion.  Cardiovascular: Normal rate, regular rhythm and normal heart sounds.  Exam reveals no gallop and no friction rub.   No murmur heard. Pulmonary/Chest: Effort normal and breath sounds normal. No respiratory distress. She has no wheezes. She has no rales.  Abdominal: Soft. There is no tenderness.  Musculoskeletal: Normal range of motion.  Neurological: She is alert and oriented to person, place, and time.  Skin: Skin is warm and dry.  Psychiatric: She has a normal mood and affect. Her behavior is normal.  Nursing note and vitals reviewed.   ED Course  Procedures (including critical care time) DIAGNOSTIC  STUDIES: Oxygen Saturation is 97% on room air, normal by my interpretation.    COORDINATION OF CARE: 11:26 PM-Discussed treatment plan which includes rapid strep screen and ibuprofen with pt at bedside and pt agreed to plan.   Labs Review Labs Reviewed  RAPID STREP SCREEN - Abnormal; Notable for the following:    Streptococcus, Group A Screen (Direct) POSITIVE (*)    All other components within normal limits    Imaging Review No results found.    EKG Interpretation None      MDM   Final diagnoses:  Strep pharyngitis    Pt presenting to ED with c/o severe sore throat that started 2 days ago.  Rapid strep: positive.  On exam, bilateral tonsillar erythema and edema w/o educates, no deviation in uvula. No tonsillar abscess. Mild trismus.  No stridor. No respiratory distress.  Pt able to keep down PO fluids. Pt given decadron, PCN, and ibuprofen in ED.  Home care instructions provided. Rx: PCN, hycet, and liquid ibuprofen. Return precautions provided. Pt verbalized understanding and agreement with tx plan.   I personally performed the services described in this documentation, which was scribed in my presence. The recorded information has been reviewed and is accurate.    Junius Finnerrin O'Malley, PA-C 11/24/14 0217  Loren Raceravid Yelverton, MD 11/30/14 [phone]

## 2014-11-24 MED ORDER — DEXAMETHASONE 1 MG/ML PO CONC
10.0000 mg | Freq: Once | ORAL | Status: AC
  Administered 2014-11-24: 10 mg via ORAL
  Filled 2014-11-24: qty 10
